# Patient Record
Sex: Female | Born: 1985 | Race: Black or African American | Hispanic: No | Marital: Single | Smoking: Current every day smoker
Health system: Southern US, Community
[De-identification: ages and names within clinical notes are randomized; demographics above are authoritative.]

## PROBLEM LIST (undated history)

## (undated) DIAGNOSIS — D649 Anemia, unspecified: Secondary | ICD-10-CM

---

## 2020-10-21 ENCOUNTER — Other Ambulatory Visit: Payer: Self-pay

## 2020-10-21 ENCOUNTER — Emergency Department (HOSPITAL_COMMUNITY)
Admission: EM | Admit: 2020-10-21 | Discharge: 2020-10-22 | Disposition: A | Discharge status: Home / Self Care | Payer: Self-pay | Attending: Emergency Medicine | Admitting: Emergency Medicine

## 2020-10-21 DIAGNOSIS — B9689 Other specified bacterial agents as the cause of diseases classified elsewhere: Secondary | ICD-10-CM | POA: Insufficient documentation

## 2020-10-21 DIAGNOSIS — N760 Acute vaginitis: Secondary | ICD-10-CM | POA: Insufficient documentation

## 2020-10-21 DIAGNOSIS — K0889 Other specified disorders of teeth and supporting structures: Secondary | ICD-10-CM | POA: Insufficient documentation

## 2020-10-21 LAB — WET PREP, GENITAL
Sperm: NONE SEEN
Trich, Wet Prep: NONE SEEN
WBC, Wet Prep HPF POC: NONE SEEN
Yeast Wet Prep HPF POC: NONE SEEN

## 2020-10-21 LAB — URINALYSIS, ROUTINE W REFLEX MICROSCOPIC
Bilirubin Urine: NEGATIVE
Glucose, UA: NEGATIVE mg/dL
Ketones, ur: NEGATIVE mg/dL
Nitrite: NEGATIVE
Protein, ur: NEGATIVE mg/dL
Specific Gravity, Urine: 1.021 (ref 1.005–1.030)
pH: 5 (ref 5.0–8.0)

## 2020-10-21 MED ORDER — METRONIDAZOLE 500 MG PO TABS: 500.0000 mg | ORAL_TABLET | Freq: Two times a day (BID) | ORAL | 0 refills | Status: AC

## 2020-10-21 MED ORDER — CEFTRIAXONE SODIUM 500 MG IJ SOLR
500.0000 mg | Freq: Once | INTRAMUSCULAR | Status: AC
  Administered 2020-10-21: 500 mg via INTRAMUSCULAR
  Filled 2020-10-21: qty 500

## 2020-10-21 MED ORDER — DOXYCYCLINE HYCLATE 100 MG PO CAPS: 100.0000 mg | ORAL_CAPSULE | Freq: Two times a day (BID) | ORAL | 0 refills | Status: AC

## 2020-10-21 NOTE — Discharge Instructions (Signed)
You were evaluated in the Emergency Department and after careful evaluation, we did not find any emergent condition requiring admission or further testing in the hospital.  Your exam/testing today was overall reassuring.  Your tooth is likely infected and needs to be removed.  Please take the antibiotics and follow up with a dentist.  Please take the antibiotics provided to treat for bacterial vaginosis, tooth infection, and STDs.  Please return to the Emergency Department if you experience any worsening of your condition.  Thank you for allowing [place] to be a part of your care.

## 2020-10-21 NOTE — ED Provider Notes (Signed)
[facility]-EMERGENCY DEPT [facility] Emergency Department Provider Note MRN:  [id number]  Arrival date & time: 10/22/20     Chief Complaint   Dental Pain and Vaginal Discharge   History of Present Illness   Melissa Townsend is a 34 y.o. year-old female with no pertinent PMH presenting to the ED with chief complaint of dental pain.  30 years of dental pain but worse of the past 2 days.  Right lower tooth.  Also with vaginal irritation and discharge since new partner last week.  Discomfort mild, constant.  No fever, no abdominal pain.  Review of Systems  A problem-focused ROS was performed. Positive for tooth pain, vaginal discharge.  Patient denies fever.    Patient's Health History   No past medical history on file.    No family history on file.  Social History   Socioeconomic History  . Marital status: Single    Spouse name: Not on file  . Number of children: Not on file  . Years of education: Not on file  . Highest education level: Not on file  Occupational History  . Not on file  Tobacco Use  . Smoking status: Not on file  Substance and Sexual Activity  . Alcohol use: Not on file  . Drug use: Not on file  . Sexual activity: Not on file  Other Topics Concern  . Not on file  Social History Narrative  . Not on file   Social Determinants of Health   Financial Resource Strain:   . Difficulty of Paying Living Expenses: Not on file  Food Insecurity:   . Worried About Programme researcher, broadcasting/film/video in the Last Year: Not on file  . Ran Out of Food in the Last Year: Not on file  Transportation Needs:   . Lack of Transportation (Medical): Not on file  . Lack of Transportation (Non-Medical): Not on file  Physical Activity:   . Days of Exercise per Week: Not on file  . Minutes of Exercise per Session: Not on file  Stress:   . Feeling of Stress : Not on file  Social Connections:   . Frequency of Communication with Friends and Family: Not on file  . Frequency of Social  Gatherings with Friends and Family: Not on file  . Attends Religious Services: Not on file  . Active Member of Clubs or Organizations: Not on file  . Attends Banker Meetings: Not on file  . Marital Status: Not on file  Intimate Partner Violence:   . Fear of Current or Ex-Partner: Not on file  . Emotionally Abused: Not on file  . Physically Abused: Not on file  . Sexually Abused: Not on file     Physical Exam   Vitals:   10/21/20 2019 10/22/20 0017  BP: 110/68 115/60  Pulse: 79 80  Resp: 16 17  Temp: 98.3 F (36.8 C) 98.7 F (37.1 C)  SpO2: 98% 98%    CONSTITUTIONAL:  well-appearing, NAD NEURO:  Alert and oriented x 3, no focal deficits EYES:  eyes equal and reactive ENT/NECK:  no LAD, no JVD CARDIO:  regular rate, well-perfused, normal S1 and S2 PULM:  CTAB no wheezing or rhonchi GI/GU:  normal bowel sounds, non-distended, non-tender MSK/SPINE:  No gross deformities, no edema SKIN:  no rash, atraumatic PSYCH:  Appropriate speech and behavior  *Additional and/or pertinent findings included in MDM below  Diagnostic and Interventional Summary    EKG Interpretation  Date/Time:    Ventricular Rate:  PR Interval:    QRS Duration:   QT Interval:    QTC Calculation:   R Axis:     Text Interpretation:        Labs Reviewed  WET PREP, GENITAL - Abnormal; Notable for the following components:      Result Value   Clue Cells Wet Prep HPF POC PRESENT (*)    All other components within normal limits  URINALYSIS, ROUTINE W REFLEX MICROSCOPIC - Abnormal; Notable for the following components:   APPearance CLOUDY (*)    Hgb urine dipstick SMALL (*)    Leukocytes,Ua SMALL (*)    Bacteria, UA RARE (*)    All other components within normal limits  POC URINE PREG, ED  GC/CHLAMYDIA PROBE AMP ([place]) NOT AT [facility]    No orders to display    Medications  cefTRIAXone (ROCEPHIN) injection 500 mg (500 mg Intramuscular Given 10/21/20 2336)     Procedures   /  Critical Care Procedures  ED Course and Medical Decision Making  I have reviewed the triage vital signs, the nursing notes, and pertinent available records from the EMR.  Listed above are laboratory and imaging tests that I personally ordered, reviewed, and interpreted and then considered in my medical decision making (see below for details).  No fever, normal vital signs, completely benign abdomen, nothing to suggest PID.  Pelvic exam deferred, patient self swabbed.  Treated empirically for STDs.  Tested positive for BV, will also treat.  These abx should cover tooth infection as well. Appropriate for dc.       Elmer Sow. Pilar Plate, MD [facility] Emergency Medicine [facility] [email or web address]  Final Clinical Impressions(s) / ED Diagnoses     ICD-10-CM   1. Tooth pain  K08.89   2. BV (bacterial vaginosis)  N76.0    B96.89     ED Discharge Orders         Ordered    doxycycline (VIBRAMYCIN) 100 MG capsule  2 times daily        10/21/20 2352    metroNIDAZOLE (FLAGYL) 500 MG tablet  2 times daily        10/21/20 2352           Discharge Instructions Discussed with and Provided to Patient:     Discharge Instructions     You were evaluated in the Emergency Department and after careful evaluation, we did not find any emergent condition requiring admission or further testing in the hospital.  Your exam/testing today was overall reassuring.  Your tooth is likely infected and needs to be removed.  Please take the antibiotics and follow up with a dentist.  Please take the antibiotics provided to treat for bacterial vaginosis, tooth infection, and STDs.  Please return to the Emergency Department if you experience any worsening of your condition.  Thank you for allowing [place] to be a part of your care.        Sabas Sous, MD 10/22/20 [phone]

## 2020-10-21 NOTE — ED Triage Notes (Signed)
Pt with right sided dental pain. Also c/o odorous vaginal discharge for a week.

## 2020-10-23 LAB — GC/CHLAMYDIA PROBE AMP (~~LOC~~) NOT AT ARMC
Chlamydia: NEGATIVE
Comment: NEGATIVE
Comment: NORMAL
Neisseria Gonorrhea: NEGATIVE

## 2021-09-19 ENCOUNTER — Emergency Department (HOSPITAL_COMMUNITY)
Admission: EM | Admit: 2021-09-19 | Discharge: 2021-09-20 | Disposition: A | Discharge status: Home / Self Care | Payer: Medicaid Other | Attending: Emergency Medicine | Admitting: Emergency Medicine

## 2021-09-19 ENCOUNTER — Other Ambulatory Visit: Payer: Self-pay

## 2021-09-19 ENCOUNTER — Encounter (HOSPITAL_COMMUNITY): Payer: Self-pay

## 2021-09-19 DIAGNOSIS — Z20822 Contact with and (suspected) exposure to covid-19: Secondary | ICD-10-CM | POA: Diagnosis not present

## 2021-09-19 DIAGNOSIS — R0781 Pleurodynia: Secondary | ICD-10-CM | POA: Diagnosis not present

## 2021-09-19 DIAGNOSIS — W19XXXA Unspecified fall, initial encounter: Secondary | ICD-10-CM

## 2021-09-19 DIAGNOSIS — O26892 Other specified pregnancy related conditions, second trimester: Secondary | ICD-10-CM | POA: Insufficient documentation

## 2021-09-19 DIAGNOSIS — F17210 Nicotine dependence, cigarettes, uncomplicated: Secondary | ICD-10-CM | POA: Insufficient documentation

## 2021-09-19 DIAGNOSIS — W101XXA Fall (on)(from) sidewalk curb, initial encounter: Secondary | ICD-10-CM | POA: Diagnosis not present

## 2021-09-19 DIAGNOSIS — Z3A25 25 weeks gestation of pregnancy: Secondary | ICD-10-CM | POA: Diagnosis not present

## 2021-09-19 DIAGNOSIS — R1012 Left upper quadrant pain: Secondary | ICD-10-CM | POA: Insufficient documentation

## 2021-09-19 LAB — RESP PANEL BY RT-PCR (FLU A&B, COVID) ARPGX2
Influenza A by PCR: NEGATIVE
Influenza B by PCR: NEGATIVE
SARS Coronavirus 2 by RT PCR: NEGATIVE

## 2021-09-19 NOTE — ED Provider Notes (Signed)
Emergency Medicine Provider Triage Evaluation Note  Melissa Townsend , a 35 y.o. female  was evaluated in triage.  Pt tripped and fell on a curb a few days ago which caused her back pain.  She is 5 months pregnant.  Patient has not felt baby move prior to this fall.  She also is endorsing COVID leg symptoms.  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  Appropriate orders placed.  was informed that the remainder of the evaluation will be completed by another provider, this initial triage assessment does not replace that evaluation, and the importance of remaining in the ED until their evaluation is complete.     Teresa Pelton 09/19/21 2141    2142, MD 09/19/21 2235

## 2021-09-19 NOTE — ED Triage Notes (Signed)
Patient slipped and fell on the curb, has back pain. Patient is [redacted] weeks pregnant. Every time she coughs, yawns, breathes in, her chest and upper left rib hurts even though she fell on her right side. Has a headache and 2 people at her job have Covid.

## 2021-09-20 ENCOUNTER — Encounter (HOSPITAL_COMMUNITY): Payer: Self-pay | Admitting: Radiology

## 2021-09-20 ENCOUNTER — Emergency Department (HOSPITAL_COMMUNITY): Payer: Medicaid Other

## 2021-09-20 NOTE — ED Notes (Signed)
Patient transported to X-ray 

## 2021-09-20 NOTE — Progress Notes (Signed)
OB rapid response nurse called by [facility] at 0205 on 09/20/2021.   Report given that patient Melissa Townsend at approximately 25 weeks reports decreased fetal movement.   OB nurse arrives at bedside at 0230. Monitors applied to assess FHR and CTX. 10 minute FHR tracing obtained showing heart rate baseline of 165 with moderate variability,no decelerations, and no 10x10 or 15x15 accelerations. Maternal vitals signs WNL. Patient reports slipping and falling 2 days ago, having felt only flutters before fall and not feeling flutters since fall. Denies vaginal discharge, denies abdominal contraction pain, denies blurry vision or RUQ pain. Affirms chronic headache prior to pregnancy. Affirms chest pain and pain under left rib with movement.   GA is uncertain as patient has not established prenatal care or had an ultrasound, patient reports 2 previous cesarean sections for breech presentation and will establish care at [facility] Department. No complications with previous pregnancies.   Reports given to Dr. Duane Lope at [phone] of patient and pregnancy status. Provider cleared Patient from the [facility] standpoint at 0311.

## 2021-09-20 NOTE — Discharge Instructions (Addendum)
You can take Tylenol and use ice and heat for pain.  Please follow-up with your OB/GYN.  You should be taking prenatal vitamins.

## 2021-09-20 NOTE — ED Provider Notes (Signed)
[facility]EMERGENCY [facility]PT Provider Note   CSN: [id number] Arrival date & time: 09/19/21  2104     History Chief Complaint  Patient presents with   Chest Pain   Covid Exposure    Melissa Townsend is a 35 y.o. female.  Patient presents to the emergency department with a chief complaint of rib pain.  She states that she is about [redacted] weeks pregnant.  She states that she had a recent mechanical fall in which she slipped and fell to the ground in the rain.  She states that prior to the fall she had been feeling the baby moving regularly, but is not feeling the baby move now.  She denies any vaginal discharge or bleeding.  Denies any fluid gush.  She also reports left sided upper abdominal/rib pain.  Lastly, she also thinks that she was exposed to COVID.  The history is provided by the patient. No language interpreter was used.      History reviewed. No pertinent past medical history.  There are no problems to display for this patient.   Past Surgical History:  Procedure Laterality Date   CESAREAN SECTION       OB History   No obstetric history on file.     History reviewed. No pertinent family history.  Social History   Tobacco Use   Smoking status: Every Day    Packs/day: 0.50    Years: 15.00    Pack years: 7.50    Types: Cigarettes   Smokeless tobacco: Never  Substance Use Topics   Alcohol use: Not Currently   Drug use: Never    Home Medications Prior to Admission medications   Not on File    Allergies    Patient has no known allergies.  Review of Systems   Review of Systems  All other systems reviewed and are negative.  Physical Exam Updated Vital Signs BP 113/62 (BP Location: Right Arm)   Pulse 92   Temp 98.5 F (36.9 C) (Oral)   Resp 16   Ht 5\' 5"  (1.651 m)   Wt 47.6 kg   SpO2 100%   BMI 17.47 kg/m   Physical Exam Vitals and nursing note reviewed.  Constitutional:      General: She is not in acute distress.     Appearance: She is well-developed.  HENT:     Head: Normocephalic and atraumatic.  Eyes:     Conjunctiva/sclera: Conjunctivae normal.  Cardiovascular:     Rate and Rhythm: Normal rate and regular rhythm.     Heart sounds: No murmur heard. Pulmonary:     Effort: Pulmonary effort is normal. No respiratory distress.     Breath sounds: Normal breath sounds.     Comments: Left-sided chest wall tenderness Abdominal:     Palpations: Abdomen is soft.     Tenderness: There is no abdominal tenderness.     Comments: Gravid  Musculoskeletal:     Cervical back: Neck supple.  Skin:    General: Skin is warm and dry.  Neurological:     Mental Status: She is alert and oriented to person, place, and time.  Psychiatric:        Mood and Affect: Mood normal.        Behavior: Behavior normal.    ED Results / Procedures / Treatments   Labs (all labs ordered are listed, but only abnormal results are displayed) Labs Reviewed  RESP PANEL BY RT-PCR (FLU A&B, COVID) [id number]    EKG  None  Radiology No results found.  Procedures Procedures   Medications Ordered in ED Medications - No data to display  ED Course  I have reviewed the triage vital signs and the nursing notes.  Pertinent labs & imaging results that were available during my care of the patient were reviewed by me and considered in my medical decision making (see chart for details).    MDM Rules/Calculators/A&P                           Patient is approximately 25 weeks.  States that she is not feeling the baby move as much after a fall.  Will have rapid response OB RN, to evaluate the patient.  Will check plain films of left ribs with a shield.  Plain films of the chest and ribs are negative.  Patient seen by and cleared by OB rapid response.  See OB RN note for details.  Will discharge patient home with PCP and OB/GYN follow-up. Final Clinical Impression(s) / ED Diagnoses Final diagnoses:  Fall, initial encounter  Rib pain     Rx / DC Orders ED Discharge Orders     None        Roxy Horseman, PA-C 09/20/21 7322    Mesner, Barbara Cower, MD 09/20/21 [phone]

## 2021-09-28 ENCOUNTER — Other Ambulatory Visit: Payer: Self-pay

## 2021-09-28 ENCOUNTER — Encounter: Payer: Self-pay | Admitting: Obstetrics and Gynecology

## 2021-09-28 ENCOUNTER — Other Ambulatory Visit: Payer: Self-pay | Admitting: Obstetrics and Gynecology

## 2021-09-28 ENCOUNTER — Observation Stay
Admission: EM | Admit: 2021-09-28 | Discharge: 2021-09-28 | Disposition: A | Discharge status: Home / Self Care | Payer: Medicaid Other | Attending: Obstetrics and Gynecology | Admitting: Obstetrics and Gynecology

## 2021-09-28 DIAGNOSIS — F17210 Nicotine dependence, cigarettes, uncomplicated: Secondary | ICD-10-CM | POA: Diagnosis not present

## 2021-09-28 DIAGNOSIS — Z3A17 17 weeks gestation of pregnancy: Secondary | ICD-10-CM | POA: Diagnosis not present

## 2021-09-28 DIAGNOSIS — O26892 Other specified pregnancy related conditions, second trimester: Secondary | ICD-10-CM | POA: Diagnosis not present

## 2021-09-28 DIAGNOSIS — O99332 Smoking (tobacco) complicating pregnancy, second trimester: Secondary | ICD-10-CM | POA: Insufficient documentation

## 2021-09-28 DIAGNOSIS — R1032 Left lower quadrant pain: Secondary | ICD-10-CM | POA: Insufficient documentation

## 2021-09-28 DIAGNOSIS — R109 Unspecified abdominal pain: Secondary | ICD-10-CM

## 2021-09-28 HISTORY — DX: Anemia, unspecified: D64.9

## 2021-09-28 LAB — URINALYSIS, COMPLETE (UACMP) WITH MICROSCOPIC
Bacteria, UA: NONE SEEN
Bilirubin Urine: NEGATIVE
Glucose, UA: NEGATIVE mg/dL
Hgb urine dipstick: NEGATIVE
Ketones, ur: 5 mg/dL — AB
Leukocytes,Ua: NEGATIVE
Nitrite: NEGATIVE
Protein, ur: 100 mg/dL — AB
Specific Gravity, Urine: 1.026 (ref 1.005–1.030)
pH: 5 (ref 5.0–8.0)

## 2021-09-28 LAB — WET PREP, GENITAL
Sperm: NONE SEEN
Trich, Wet Prep: NONE SEEN
WBC, Wet Prep HPF POC: NONE SEEN
Yeast Wet Prep HPF POC: NONE SEEN

## 2021-09-28 LAB — CHLAMYDIA/NGC RT PCR (ARMC ONLY)
Chlamydia Tr: NOT DETECTED
N gonorrhoeae: NOT DETECTED

## 2021-09-28 MED ORDER — METRONIDAZOLE 500 MG PO TABS: 500.0000 mg | ORAL_TABLET | Freq: Two times a day (BID) | ORAL | 0 refills | Status: AC

## 2021-09-28 NOTE — Progress Notes (Signed)
Wet prep resulted with Clue cells, Rx Flagyl to pharmacy on file.

## 2021-09-28 NOTE — Progress Notes (Signed)
Phone number confirmed for [facility] to contact if any medications needed

## 2021-09-28 NOTE — Discharge Summary (Signed)
Melissa Townsend is a 35 y.o. female. She is at [redacted]w[redacted]d gestation. Patient's last menstrual period was 05/19/2021. Estimated Date of Delivery: 03/02/22  Prenatal care site: unassigned pt- no PNC  Current pregnancy complicated by:  No PNC 2 prior CS Hx STI- trich and chlamydia treated 07/2021  Chief complaint: abdominal cramping in lower left quadrant, rates pain at 8/10. Started feeling pain around 4pm. Takes percocet regularly for tooth pain. States that she has a malordorous white vaginal discharge.   S: Resting comfortably. no VB.no LOF,  reports feeling fetal movement flutters. Denies: HA, visual changes, SOB, or RUQ/epigastric pain  Maternal Medical History:   Past Medical History:  Diagnosis Date   Anemia     Past Surgical History:  Procedure Laterality Date   CESAREAN SECTION      No Known Allergies  Prior to Admission medications   Medication Sig Start Date End Date Taking? Authorizing Provider  oxyCODONE-acetaminophen (PERCOCET) 10-325 MG tablet Take 1 tablet by mouth every 4 (four) hours as needed for pain.   Yes [provider]      Social History: She  reports that she has been smoking cigarettes. She has a 7.50 pack-year smoking history. She has never used smokeless tobacco. She reports that she does not currently use alcohol. She reports current drug use. Drug: Marijuana.  Family History:  no history of gyn cancers  Review of Systems: A full review of systems was performed and negative except as noted in the HPI.     O:  BP 117/63 (BP Location: Left Arm)   Pulse 84   Temp 98.1 F (36.7 C) (Oral)   Resp 16   Ht 5\' 5"  (1.651 m)   Wt 47.6 kg   LMP 05/19/2021 Comment: [redacted] wks pregnant. pt will be double shielded.  BMI 17.47 kg/m  No results found for this or any previous visit (from the past 48 hour(s)).   Constitutional: NAD, AAOx3  HE/ENT: extraocular movements grossly intact, moist mucous membranes CV: RRR PULM: nl respiratory effort,  CTABL     Abd: gravid, non-tender, non-distended, soft      Ext: Non-tender, Nonedematous   Psych: mood appropriate, speech normal Pelvic: deferred  Fetal  monitoring: Pos FHR noted on bedside 05/21/2021.    A/P: 35 y.o. [redacted]w[redacted]d here for antenatal surveillance for abdominal pain, round ligament pain.   Principle Diagnosis:  round ligament pain, [redacted]wks gestation, no PNC  Sent UA, wet prep and GC/CT; pending results.  Will call with results/Rx if needed.  FHR present DC home prior to results due to pts ride available.  D/c home stable, precautions reviewed, follow-up as scheduled.    [redacted]w[redacted]d, CNM 09/28/2021  5:32 AM

## 2021-09-28 NOTE — Progress Notes (Signed)

## 2021-09-28 NOTE — OB Triage Note (Signed)
Pt Melissa Townsend 35 y.o. presents to labor and delivery triage reporting abdominal cramping in lower left quadrant, rates pain at 8/10. Started feeling pain around 4pm. Takes percocet regularly for tooth pain. States that she has a malordorous white vaginal discharge.Pt is a [id number] at Unknown. Pt denies signs and symptons consistent with rupture of membranes or active vaginal bleeding. Pt denies contractions and states flutters. External FM and TOCO applied to non-tender abdomen and assessing. Initial FHR not heard. Vital signs obtained and within normal limits. Provider notified of pt.

## 2022-02-27 ENCOUNTER — Emergency Department (HOSPITAL_COMMUNITY): Payer: Medicaid Other

## 2022-02-27 ENCOUNTER — Encounter (HOSPITAL_COMMUNITY): Payer: Self-pay | Admitting: Emergency Medicine

## 2022-02-27 ENCOUNTER — Emergency Department (HOSPITAL_COMMUNITY)
Admission: EM | Admit: 2022-02-27 | Discharge: 2022-02-27 | Disposition: A | Discharge status: Home / Self Care | Payer: Medicaid Other | Attending: Emergency Medicine | Admitting: Emergency Medicine

## 2022-02-27 DIAGNOSIS — R519 Headache, unspecified: Secondary | ICD-10-CM | POA: Diagnosis present

## 2022-02-27 DIAGNOSIS — Z79899 Other long term (current) drug therapy: Secondary | ICD-10-CM | POA: Diagnosis not present

## 2022-02-27 LAB — CBC WITH DIFFERENTIAL/PLATELET
Abs Immature Granulocytes: 0.03 10*3/uL (ref 0.00–0.07)
Basophils Absolute: 0 10*3/uL (ref 0.0–0.1)
Basophils Relative: 0 %
Eosinophils Absolute: 0.1 10*3/uL (ref 0.0–0.5)
Eosinophils Relative: 1 %
HCT: 31 % — ABNORMAL LOW (ref 36.0–46.0)
Hemoglobin: 10.5 g/dL — ABNORMAL LOW (ref 12.0–15.0)
Immature Granulocytes: 0 %
Lymphocytes Relative: 13 %
Lymphs Abs: 1 10*3/uL (ref 0.7–4.0)
MCH: 30.7 pg (ref 26.0–34.0)
MCHC: 33.9 g/dL (ref 30.0–36.0)
MCV: 90.6 fL (ref 80.0–100.0)
Monocytes Absolute: 0.3 10*3/uL (ref 0.1–1.0)
Monocytes Relative: 4 %
Neutro Abs: 6.1 10*3/uL (ref 1.7–7.7)
Neutrophils Relative %: 82 %
Platelets: 218 10*3/uL (ref 150–400)
RBC: 3.42 MIL/uL — ABNORMAL LOW (ref 3.87–5.11)
RDW: 16.3 % — ABNORMAL HIGH (ref 11.5–15.5)
WBC: 7.5 10*3/uL (ref 4.0–10.5)
nRBC: 0 % (ref 0.0–0.2)

## 2022-02-27 LAB — COMPREHENSIVE METABOLIC PANEL
ALT: 21 U/L (ref 0–44)
AST: 22 U/L (ref 15–41)
Albumin: 3.2 g/dL — ABNORMAL LOW (ref 3.5–5.0)
Alkaline Phosphatase: 110 U/L (ref 38–126)
Anion gap: 8 (ref 5–15)
BUN: 8 mg/dL (ref 6–20)
CO2: 25 mmol/L (ref 22–32)
Calcium: 8.5 mg/dL — ABNORMAL LOW (ref 8.9–10.3)
Chloride: 104 mmol/L (ref 98–111)
Creatinine, Ser: 0.75 mg/dL (ref 0.44–1.00)
GFR, Estimated: >60 mL/min (ref >60)
Glucose, Bld: 107 mg/dL — ABNORMAL HIGH (ref 70–99)
Potassium: 3.3 mmol/L — ABNORMAL LOW (ref 3.5–5.1)
Sodium: 137 mmol/L (ref 135–145)
Total Bilirubin: 0.4 mg/dL (ref 0.3–1.2)
Total Protein: 6.9 g/dL (ref 6.5–8.1)

## 2022-02-27 LAB — URINALYSIS, ROUTINE W REFLEX MICROSCOPIC
Bilirubin Urine: NEGATIVE
Glucose, UA: NEGATIVE mg/dL
Ketones, ur: 15 mg/dL — AB
Leukocytes,Ua: NEGATIVE
Nitrite: NEGATIVE
Protein, ur: 100 mg/dL — AB
Specific Gravity, Urine: 1.02 (ref 1.005–1.030)
pH: 7 (ref 5.0–8.0)

## 2022-02-27 LAB — URINALYSIS, MICROSCOPIC (REFLEX): Bacteria, UA: NONE SEEN

## 2022-02-27 LAB — I-STAT BETA HCG BLOOD, ED (MC, WL, AP ONLY): I-stat hCG, quantitative: 174.2 m[IU]/mL — ABNORMAL HIGH (ref <5)

## 2022-02-27 MED ORDER — BUTALBITAL-APAP-CAFFEINE 50-325-40 MG PO TABS
1.0000 | ORAL_TABLET | Freq: Once | ORAL | Status: AC
  Administered 2022-02-27: 1 via ORAL
  Filled 2022-02-27: qty 1

## 2022-02-27 MED ORDER — OXYCODONE HCL 5 MG PO TABS: 5.0000 mg | ORAL_TABLET | ORAL | 0 refills | Status: DC | PRN

## 2022-02-27 MED ORDER — BUTALBITAL-APAP-CAFFEINE 50-325-40 MG PO TABS: 1.0000 | ORAL_TABLET | Freq: Four times a day (QID) | ORAL | 0 refills | Status: AC | PRN

## 2022-02-27 MED ORDER — PROCHLORPERAZINE EDISYLATE 10 MG/2ML IJ SOLN
10.0000 mg | Freq: Once | INTRAMUSCULAR | Status: DC
  Filled 2022-02-27: qty 2

## 2022-02-27 MED ORDER — LABETALOL HCL 100 MG PO TABS: 200.0000 mg | ORAL_TABLET | Freq: Two times a day (BID) | ORAL | 0 refills | Status: DC

## 2022-02-27 MED ORDER — DIPHENHYDRAMINE HCL 50 MG/ML IJ SOLN
25.0000 mg | Freq: Once | INTRAMUSCULAR | Status: AC
  Administered 2022-02-27: 25 mg via INTRAVENOUS
  Filled 2022-02-27: qty 1

## 2022-02-27 MED ORDER — SODIUM CHLORIDE 0.9 % IV BOLUS
1000.0000 mL | Freq: Once | INTRAVENOUS | Status: AC
  Administered 2022-02-27: 1000 mL via INTRAVENOUS

## 2022-02-27 MED ORDER — LABETALOL HCL 200 MG PO TABS
200.0000 mg | ORAL_TABLET | Freq: Once | ORAL | Status: AC
  Administered 2022-02-27: 200 mg via ORAL
  Filled 2022-02-27: qty 1

## 2022-02-27 NOTE — ED Provider Notes (Signed)
?[facility]-EMERGENCY DEPT ?Provider Note ? ? ?CSN: [id number] ?Arrival date & time: 02/27/22  1422 ? ?  ? ?History ? ?Chief Complaint  ?Patient presents with  ? Postpartum Complications  ? ? ?Melissa Townsend is a 36 y.o. female. ? ?Headache on and off for 4 days after epidural for C-section. Patient states lying flat has helped but not much. Narcotics and OTC meds not helping. Denies any visual changes, leg weakness, loss of bowl or bladder. Patient states some pain in the low abdomen at time but states feels no different than prior c section pain. Some low back pain at times. No weakness or numbness otherwise.   Patient need blood transfusion x 3 after csection and noticed to have high blood pressure after getting blood products and now on procardia. ? ?The history is provided by the patient.  ?Headache ?Associated symptoms: no abdominal pain, no back pain, no blurred vision, no congestion, no cough, no diarrhea, no dizziness, no drainage, no ear pain, no eye pain, no facial pain, no fatigue, no fever, no focal weakness, no hearing loss, no loss of balance, no myalgias, no nausea, no near-syncope, no neck pain, no neck stiffness, no numbness, no paresthesias, no photophobia, no seizures, no sinus pressure, no sore throat, no swollen glands, no syncope, no tingling, no URI, no visual change, no vomiting and no weakness   ? ?  ? ?Home Medications ?Prior to Admission medications   ?Medication Sig Start Date End Date Taking? Authorizing Provider  ?butalbital-acetaminophen-caffeine (FIORICET) 50-325-40 MG tablet Take 1-2 tablets by mouth every 6 (six) hours as needed for headache. 02/27/22 02/27/23 Yes Zoi Devine, DO  ?labetalol (NORMODYNE) 100 MG tablet Take 2 tablets (200 mg total) by mouth 2 (two) times daily for 14 days. 02/27/22 03/13/22 Yes Senai Kingsley, DO  ?oxyCODONE (ROXICODONE) 5 MG immediate release tablet Take 1 tablet (5 mg total) by mouth every 4 (four) hours as needed for up to  10 doses for breakthrough pain. 02/27/22  Yes Corah Willeford, DO  ?oxyCODONE-acetaminophen (PERCOCET) 10-325 MG tablet Take 1 tablet by mouth every 4 (four) hours as needed for pain.    [provider]  ?   ? ?Allergies    ?Patient has no known allergies.   ? ?Review of Systems   ?Review of Systems  ?Constitutional:  Negative for fatigue and fever.  ?HENT:  Negative for congestion, ear pain, hearing loss, postnasal drip, sinus pressure and sore throat.   ?Eyes:  Negative for blurred vision, photophobia and pain.  ?Respiratory:  Negative for cough.   ?Cardiovascular:  Negative for syncope and near-syncope.  ?Gastrointestinal:  Negative for abdominal pain, diarrhea, nausea and vomiting.  ?Musculoskeletal:  Negative for back pain, myalgias, neck pain and neck stiffness.  ?Neurological:  Positive for headaches. Negative for dizziness, focal weakness, seizures, weakness, numbness, paresthesias and loss of balance.  ? ?Physical Exam ?Updated Vital Signs ? ?ED Triage Vitals [02/27/22 1431]  ?Enc Vitals Group  ?   BP (!) 138/92  ?   Pulse Rate 69  ?   Resp 17  ?   Temp 98.6 ?F (37 ?C)  ?   Temp Source Oral  ?   SpO2 99 %  ?   Weight   ?   Height   ?   Head Circumference   ?   Peak Flow   ?   Pain Score   ?   Pain Loc   ?   Pain Edu?   ?  Excl. in GC?   ? ? ?Physical Exam ?Vitals and nursing note reviewed.  ?Constitutional:   ?   General: She is not in acute distress. ?   Appearance: She is well-developed. She is not ill-appearing.  ?HENT:  ?   Head: Normocephalic and atraumatic.  ?   Nose: Nose normal.  ?   Mouth/Throat:  ?   Mouth: Mucous membranes are moist.  ?Eyes:  ?   Extraocular Movements: Extraocular movements intact.  ?   Conjunctiva/sclera: Conjunctivae normal.  ?   Pupils: Pupils are equal, round, and reactive to light.  ?Cardiovascular:  ?   Rate and Rhythm: Normal rate and regular rhythm.  ?   Pulses: Normal pulses.  ?   Heart sounds: Normal heart sounds. No murmur heard. ?Pulmonary:  ?   Effort:  Pulmonary effort is normal. No respiratory distress.  ?   Breath sounds: Normal breath sounds.  ?Abdominal:  ?   Palpations: Abdomen is soft.  ?   Tenderness: There is no abdominal tenderness. There is no guarding.  ?   Comments: C section scar well appearing.  ?Musculoskeletal:  ?   Cervical back: Neck supple.  ?Skin: ?   General: Skin is warm and dry.  ?   Capillary Refill: Capillary refill takes less than 2 seconds.  ?Neurological:  ?   General: No focal deficit present.  ?   Mental Status: She is alert and oriented to person, place, and time.  ?   Cranial Nerves: No cranial nerve deficit.  ?   Sensory: No sensory deficit.  ?   Motor: No weakness.  ?   Coordination: Coordination normal.  ? ? ?ED Results / Procedures / Treatments   ?Labs ?(all labs ordered are listed, but only abnormal results are displayed) ?Labs Reviewed  ?CBC WITH DIFFERENTIAL/PLATELET - Abnormal; Notable for the following components:  ?    Result Value  ? RBC 3.42 (*)   ? Hemoglobin 10.5 (*)   ? HCT 31.0 (*)   ? RDW 16.3 (*)   ? All other components within normal limits  ?COMPREHENSIVE METABOLIC PANEL - Abnormal; Notable for the following components:  ? Potassium 3.3 (*)   ? Glucose, Bld 107 (*)   ? Calcium 8.5 (*)   ? Albumin 3.2 (*)   ? All other components within normal limits  ?URINALYSIS, ROUTINE W REFLEX MICROSCOPIC - Abnormal; Notable for the following components:  ? Hgb urine dipstick SMALL (*)   ? Ketones, ur 15 (*)   ? Protein, ur 100 (*)   ? All other components within normal limits  ?I-STAT BETA HCG BLOOD, ED ([facility], [facility], [facility] ONLY) - Abnormal; Notable for the following components:  ? I-stat hCG, quantitative 174.2 (*)   ? All other components within normal limits  ?URINALYSIS, MICROSCOPIC (REFLEX)  ? ? ?EKG ?EKG Interpretation ? ?Date/Time:  Sunday February 27 2022 14:54:20 EDT ?Ventricular Rate:  73 ?PR Interval:  165 ?QRS Duration: 78 ?QT Interval:  404 ?QTC Calculation: 446 ?R Axis:   87 ?Text Interpretation: Sinus rhythm Consider left  ventricular hypertrophy Confirmed by Virgina Norfolk [id number]) on 02/27/2022 3:19:10 PM ? ?Radiology ?CT Head Wo Contrast ? ?Result Date: 02/27/2022 ?CLINICAL DATA:  Headache.  Three days post partum.  Hypertension. EXAM: CT HEAD WITHOUT CONTRAST TECHNIQUE: Contiguous axial images were obtained from the base of the skull through the vertex without intravenous contrast. RADIATION DOSE REDUCTION: This exam was performed according to the departmental dose-optimization program which includes automated exposure  control, adjustment of the mA and/or kV according to patient size and/or use of iterative reconstruction technique. COMPARISON:  None. FINDINGS: Brain: No evidence of acute infarction, hemorrhage, hydrocephalus, extra-axial collection or mass lesion/mass effect. Vascular: No hyperdense vessel or unexpected calcification. Skull: Normal. Negative for fracture or focal lesion. Sinuses/Orbits: Visualized globes and orbits are unremarkable. Visualized sinuses are clear. Other: None. IMPRESSION: Normal unenhanced CT scan of the brain. Electronically Signed   By: Amie Portlandavid  Ormond M.D.   On: 02/27/2022 15:42  ? ?DG Chest Portable 1 View ? ?Result Date: 02/27/2022 ?CLINICAL DATA:  Weakness EXAM: PORTABLE CHEST 1 VIEW COMPARISON:  09/20/2021 FINDINGS: The heart size and mediastinal contours are within normal limits. Both lungs are clear. The visualized skeletal structures are unremarkable. IMPRESSION: No active disease. Electronically Signed   By: Duanne GuessNicholas  Plundo D.O.   On: 02/27/2022 16:02   ? ?Procedures ?Procedures  ? ? ?Medications Ordered in ED ?Medications  ?prochlorperazine (COMPAZINE) injection 10 mg (10 mg Intravenous Not Given 02/27/22 1630)  ?labetalol (NORMODYNE) tablet 200 mg (has no administration in time range)  ?sodium chloride 0.9 % bolus 1,000 mL (1,000 mLs Intravenous New Bag/Given 02/27/22 1622)  ?diphenhydrAMINE (BENADRYL) injection 25 mg (25 mg Intravenous Given 02/27/22 1623)  ?butalbital-acetaminophen-caffeine  (FIORICET) 50-325-40 MG per tablet 1 tablet (1 tablet Oral Given 02/27/22 1628)  ? ? ?ED Course/ Medical Decision Making/ A&P ?  ?                        ?Medical Decision Making ?Amount and/or Complexity of

## 2022-02-27 NOTE — ED Provider Triage Note (Signed)
Emergency Medicine Provider Triage Evaluation Note ? ?Melissa Townsend , a 36 y.o. female  was evaluated in triage.  Pt complains of headache, neck pain, sob. She had a csection 3 days ago. She has been treated for HTN throughout pregnancy. Reportedly refused to go to MAU PTA. . ? ?Review of Systems  ?Positive: Headache, neck pain, sob ?Negative: fever ? ?Physical Exam  ?BP (!) 138/92 (BP Location: Left Arm)   Pulse 69   Temp 98.6 ?F (37 ?C) (Oral)   Resp 17   LMP 05/19/2021 Comment: [redacted] wks pregnant. pt will be double shielded.  SpO2 99%  ?Gen:   Awake, no distress   ?Resp:  Normal effort  ?MSK:   Moves extremities without difficulty  ?Other:  Perrl, eoms intact, clear speech, no facial droop, moving all extremities ? ?Medical Decision Making  ?Medically screening exam initiated at 2:39 PM.  Appropriate orders placed.  Melissa Townsend was informed that the remainder of the evaluation will be completed by another provider, this initial triage assessment does not replace that evaluation, and the importance of remaining in the ED until their evaluation is complete. ? ? ?  ?Rodney Booze, PA-C ?02/27/22 1445 ? ?

## 2022-02-27 NOTE — Discharge Instructions (Addendum)
Recommend taking Fioricet before taking any Roxicodone.  Both of these medications are mildly sedating.  I would make sure that you take these medications at least 4 to 6 hours apart from each other.  Discontinue your nifedipine and start labetalol.  You have been given the first dose tonight.  Take your next dose in the morning and follow-up with your OB/GYN tomorrow to further discuss your headache.  At this time do not believe that it is from preeclampsia and likely from epidural headache.  However they need to continue to monitor you for preeclampsia.  Please return if symptoms worsen.  Recommend calling your OB as well. ?

## 2022-02-27 NOTE — ED Triage Notes (Signed)
Per EMS, patient from home, three days postpartum c-section, c/o back pain at site of epidural radiating to neck and head. Reports hypertension and anemia throughout pregnancy. ? ?BP 122/70 ?HR 88 ?98% RA ?

## 2022-03-08 ENCOUNTER — Encounter (HOSPITAL_COMMUNITY): Payer: Self-pay

## 2022-03-08 ENCOUNTER — Inpatient Hospital Stay (HOSPITAL_COMMUNITY)
Admission: AD | Admit: 2022-03-08 | Discharge: 2022-03-08 | Disposition: A | Discharge status: Home / Self Care | Payer: Medicaid Other | Attending: Family Medicine | Admitting: Family Medicine

## 2022-03-08 ENCOUNTER — Inpatient Hospital Stay (HOSPITAL_COMMUNITY): Payer: Medicaid Other

## 2022-03-08 DIAGNOSIS — O9089 Other complications of the puerperium, not elsewhere classified: Secondary | ICD-10-CM

## 2022-03-08 DIAGNOSIS — O902 Hematoma of obstetric wound: Secondary | ICD-10-CM | POA: Diagnosis not present

## 2022-03-08 DIAGNOSIS — O99335 Smoking (tobacco) complicating the puerperium: Secondary | ICD-10-CM | POA: Insufficient documentation

## 2022-03-08 DIAGNOSIS — F17210 Nicotine dependence, cigarettes, uncomplicated: Secondary | ICD-10-CM | POA: Insufficient documentation

## 2022-03-08 LAB — CBC
HCT: 33.2 % — ABNORMAL LOW (ref 36.0–46.0)
Hemoglobin: 11 g/dL — ABNORMAL LOW (ref 12.0–15.0)
MCH: 30.4 pg (ref 26.0–34.0)
MCHC: 33.1 g/dL (ref 30.0–36.0)
MCV: 91.7 fL (ref 80.0–100.0)
Platelets: 307 10*3/uL (ref 150–400)
RBC: 3.62 MIL/uL — ABNORMAL LOW (ref 3.87–5.11)
RDW: 15.1 % (ref 11.5–15.5)
WBC: 8.4 10*3/uL (ref 4.0–10.5)
nRBC: 0 % (ref 0.0–0.2)

## 2022-03-08 LAB — TYPE AND SCREEN
ABO/RH(D): A POS
Antibody Screen: NEGATIVE

## 2022-03-08 MED ORDER — LACTATED RINGERS IV SOLN: INTRAVENOUS | Status: DC

## 2022-03-08 MED ORDER — IOHEXOL 350 MG/ML SOLN
100.0000 mL | Freq: Once | INTRAVENOUS | Status: AC | PRN
  Administered 2022-03-08: 100 mL via INTRAVENOUS

## 2022-03-08 MED ORDER — MISOPROSTOL 200 MCG PO TABS: 800.0000 ug | ORAL_TABLET | Freq: Once | ORAL | 1 refills | Status: DC

## 2022-03-08 MED ORDER — OXYCODONE-ACETAMINOPHEN 10-325 MG PO TABS: 1.0000 | ORAL_TABLET | ORAL | 0 refills | Status: DC | PRN

## 2022-03-08 MED ORDER — HYDROMORPHONE HCL 1 MG/ML IJ SOLN
1.0000 mg | INTRAMUSCULAR | Status: DC | PRN
  Administered 2022-03-08: 1 mg via INTRAVENOUS
  Filled 2022-03-08: qty 1

## 2022-03-08 NOTE — Discharge Instructions (Signed)
-  take misoprostal pills tonight and then repeat in 3 days if still continuing to bleed ?-return to MAU if feeling lightheaded, dizzy, passing large clots.  ?- ?

## 2022-03-08 NOTE — MAU Note (Signed)
Transport at bedside to take patient to CT.

## 2022-03-08 NOTE — MAU Note (Signed)
.  Melissa Townsend is a 36 y.o. at [redacted]w[redacted]d here in MAU reporting: heavy vaginal bleeding and abdominal pain that began around 1900 on 03/07/22. Abdominal pain 10/10, HA 7/10. Patient had a C/S on 02/23/22 due to HBP. Reports using 7 regular pads within the last hour with some large clots.  ? ?Pain score: 10/10, 7/10 ?Vitals:  ? 03/08/22 1240  ?BP: 115/71  ?Pulse: 81  ?Resp: 16  ?Temp: 98.4 ?F (36.9 ?C)  ?SpO2: 99%  ?   ? ? ?

## 2022-03-08 NOTE — MAU Provider Note (Signed)
Patient Melissa Townsend is a 36 y.o. G3P2003 ? At 2 weeks post repeat C/section (now s/p 3 c/sections). She reports that she was sent home from the hospital with a hematoma; she reports it has been hurting since she left the hospital.  ? ? ?She reports that she started back working at [employer] last week. She has been very active at work.  ?Reports heavy vaginal bleeding that started yesterday; last night she said she went through multiple pads. She has not had a BM in three days. Patient denies nausea, vomiting, fever, chest pain, SOB, dizziness.  ?History  ?  ? ?CSN: [id number] ? ?Arrival date and time: 03/08/22 1202 ? ? Event Date/Time  ? First Provider Initiated Contact with Patient 03/08/22 1254   ?  ? ?Chief Complaint  ?Patient presents with  ? Vaginal Bleeding  ? ?Vaginal Bleeding ?The patient's primary symptoms include vaginal bleeding. The current episode started yesterday. The problem occurs constantly. Associated symptoms include constipation. Pertinent negatives include no diarrhea, dysuria, fever, urgency or vomiting. The vaginal discharge was bloody. The vaginal bleeding is typical of menses. She has not been passing clots. She has not been passing tissue.  ? ?OB History   ? ? Gravida  ?3  ? Para  ?2  ? Term  ?2  ? Preterm  ?   ? AB  ?   ? Living  ?3  ?  ? ? SAB  ?   ? IAB  ?   ? Ectopic  ?   ? Multiple  ?   ? Live Births  ?3  ?   ?  ?  ? ? ?Past Medical History:  ?Diagnosis Date  ? Anemia   ? ? ?Past Surgical History:  ?Procedure Laterality Date  ? CESAREAN SECTION    ? ? ?History reviewed. No pertinent family history. ? ?Social History  ? ?Tobacco Use  ? Smoking status: Every Day  ?  Packs/day: 0.50  ?  Years: 15.00  ?  Pack years: 7.50  ?  Types: Cigarettes  ? Smokeless tobacco: Never  ?Vaping Use  ? Vaping Use: Former  ?Substance Use Topics  ? Alcohol use: Not Currently  ? Drug use: Not Currently  ?  Types: Marijuana  ? ? ?Allergies: No Known Allergies ? ?No medications prior to admission.   ? ? ?Review of Systems  ?Constitutional:  Negative for fever.  ?Respiratory: Negative.    ?Cardiovascular: Negative.   ?Gastrointestinal:  Positive for constipation. Negative for diarrhea and vomiting.  ?Genitourinary:  Positive for vaginal bleeding. Negative for dysuria and urgency.  ?Neurological: Negative.   ?Psychiatric/Behavioral: Negative.    ?Physical Exam  ? ?Blood pressure 117/78, pulse 74, temperature 98.4 ?F (36.9 ?C), temperature source Oral, resp. rate 16, height 5\' 4"  (1.626 m), weight 50.3 kg, last menstrual period 05/19/2021, SpO2 99 %, unknown if currently breastfeeding. ? ?Physical Exam ?Constitutional:   ?   Appearance: Normal appearance.  ?Cardiovascular:  ?   Rate and Rhythm: Normal rate.  ?Pulmonary:  ?   Effort: Pulmonary effort is normal.  ?Abdominal:  ?   General: Abdomen is flat. Bowel sounds are normal. There is no distension.  ?   Palpations: Abdomen is soft.  ?   Tenderness: There is abdominal tenderness. There is guarding.  ?Genitourinary: ?   General: Normal vulva.  ?   Comments: NEFG; dark red blood in the vagina; no clots; no odor, patient is extremely uncomfortable with speculum exam ?Musculoskeletal:     ?  General: Normal range of motion.  ?Skin: ?   General: Skin is warm.  ?Neurological:  ?   General: No focal deficit present.  ?   Mental Status: She is alert.  ?Psychiatric:     ?   Mood and Affect: Mood normal.  ? ? ?MAU Course  ?Procedures ? ?MDM ?-Hgb is 11; platelets are 307 ?Reassessment (6:49 AM) ?-patient reports bleeding has tapering off; she has had 1 mg of dilaudid and feels that it is helping ?-patient sent to CT due to abdominal pain and concern for possible post-op complications; CT scan shows seroma measuring 2.4x 1.3 on LLQ but otherwise unremarkable; images were reviewed with Dr. Kennon Rounds.  ?-patient offered cytotec for bleeding but she elected to leave and take outpatient ?-records reviewed from c/section in [place]; no remarkable events during surgery and she  was discharged home with small hematoma at incision site on Left lower quadrant ?Assessment and Plan  ? ?1. Postpartum hemorrhage, unspecified type   ?2. Cesarean section wound seroma, postpartum   ? ?-patient stable for discharge; strict bleeding precautions reviewed as well as importance of rest breaks as her activity level after the c/section could be contributing to her ongoing pain and bleeding ?-discussed how to take cytotec, side effects; limited supply of percocet RX written as well ?-patient to keep follow up appt next week with her OB in [place] ?-all questions answered  ? ?Mervyn Skeeters Alexandra Lipps ?03/09/2022, 6:49 AM  ?

## 2022-03-08 NOTE — MAU Note (Signed)
Patient signed physical copy of AVS and left without BP being taken. ?

## 2022-09-19 DIAGNOSIS — K0889 Other specified disorders of teeth and supporting structures: Secondary | ICD-10-CM | POA: Diagnosis not present

## 2022-09-19 DIAGNOSIS — L0201 Cutaneous abscess of face: Secondary | ICD-10-CM | POA: Diagnosis not present

## 2022-11-25 IMAGING — CT CT CTA ABD/PEL W/CM AND/OR W/O CM
2 of 11 series · 13 of 46 positions shown, 17 images · IV contrast (APPLIED)
Comparison: None.

CLINICAL DATA: Left upper quadrant abdominal pain, vaginal
bleeding. Status post C-section 2 weeks previously.

EXAM:
CTA ABDOMEN AND PELVIS WITHOUT AND WITH CONTRAST
TECHNIQUE: Multidetector CT imaging of the abdomen and pelvis was performed
using the standard protocol during bolus administration of
intravenous contrast. Multiplanar reconstructed images and MIPs were
obtained and reviewed to evaluate the vascular anatomy.

[Series 11: thins · axial · 0.67mm/px · z∈[+810,+1162]mm · 11 of 993 slices shown, 15 images]
[im 56/993  soft-tissue]
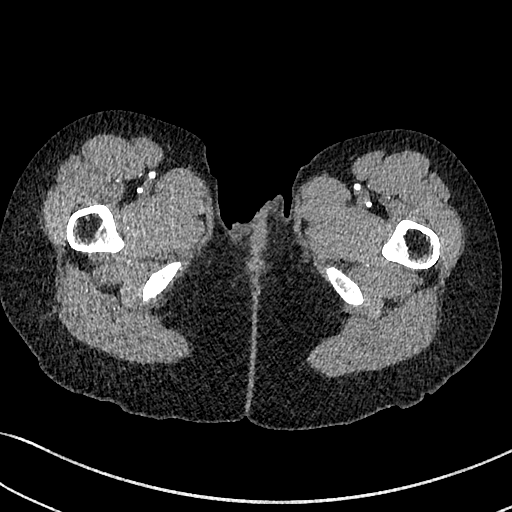
[im 56/993  bone]
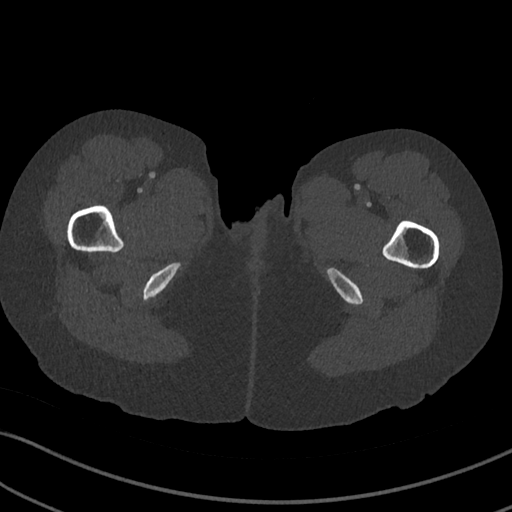
[im 166/993  soft-tissue]
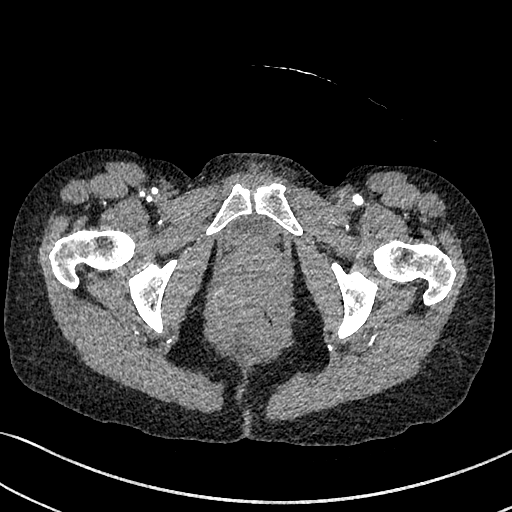
[im 276/993  soft-tissue]
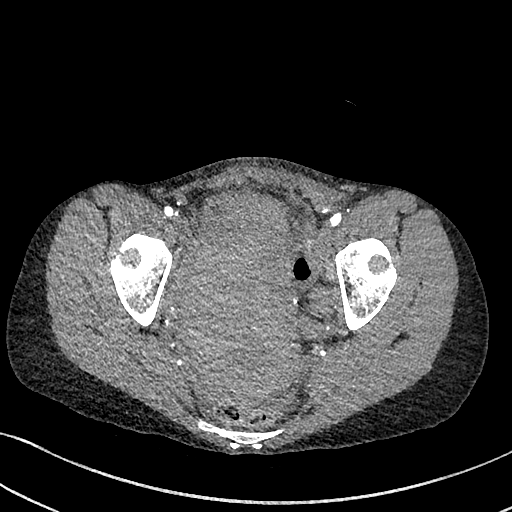
[im 386/993  soft-tissue]
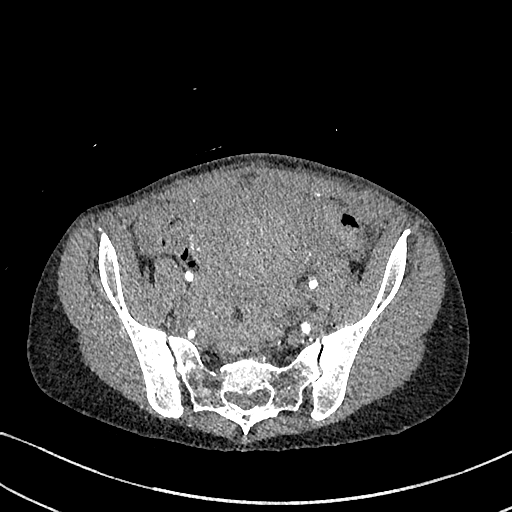
[im 497/993  soft-tissue]
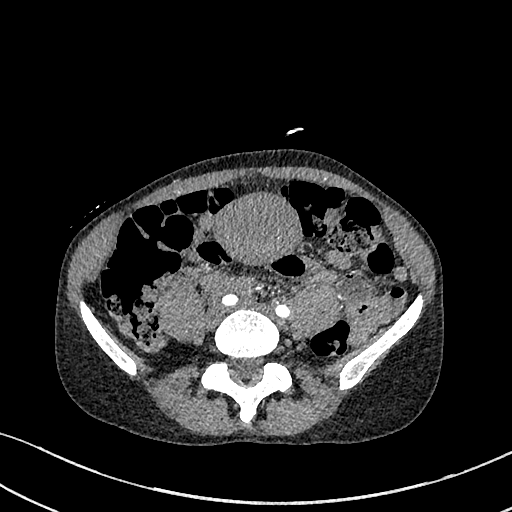
[im 607/993  soft-tissue]
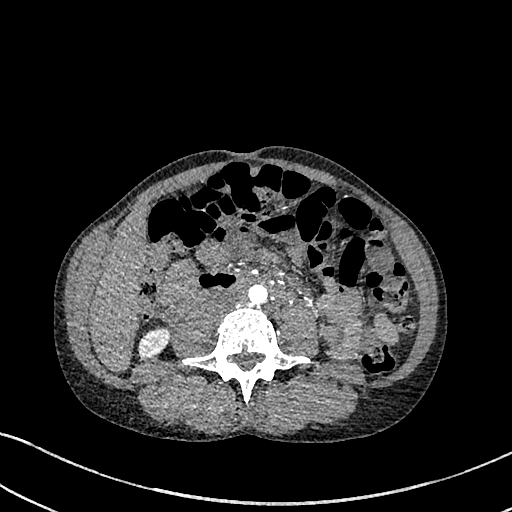
[im 717/993  soft-tissue]
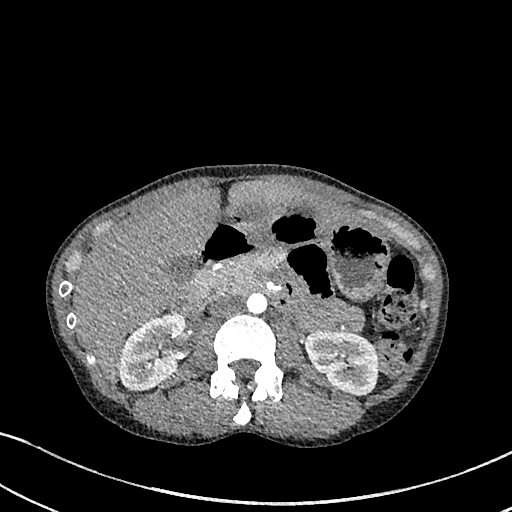
[im 772/993  lung]
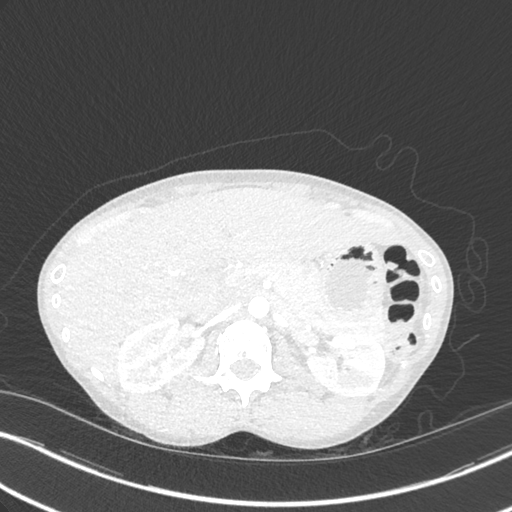
[im 827/993  soft-tissue]
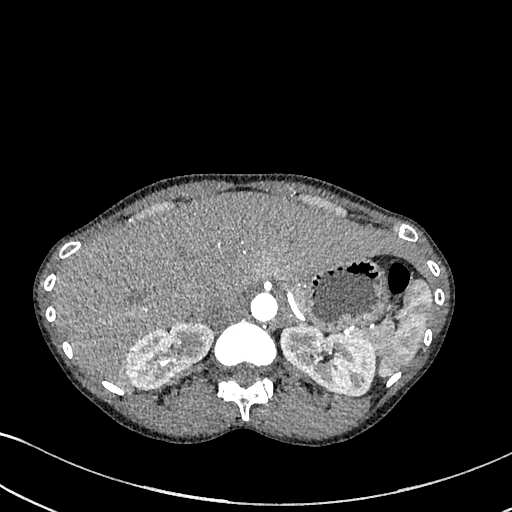
[im 827/993  lung]
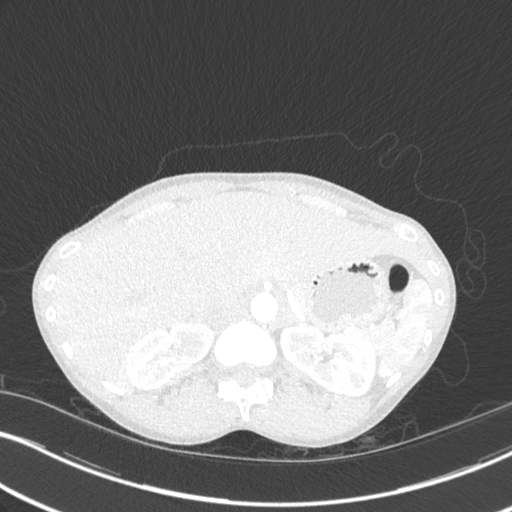
[im 882/993  lung]
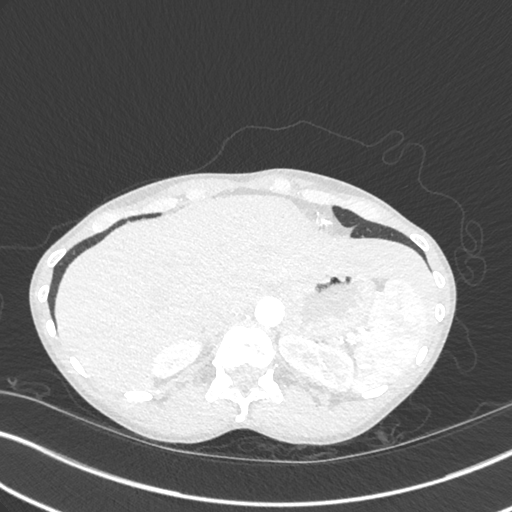
[im 937/993  soft-tissue]
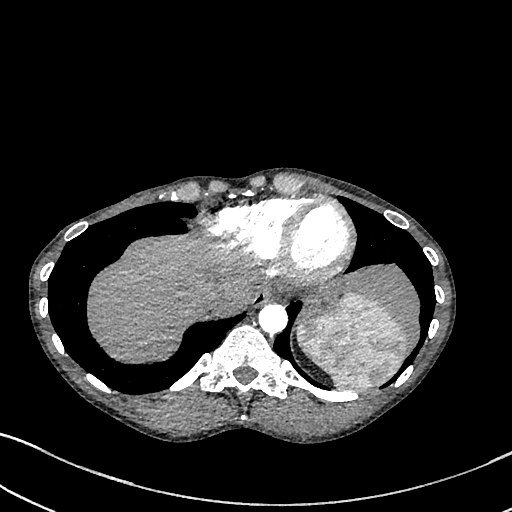
[im 937/993  lung]
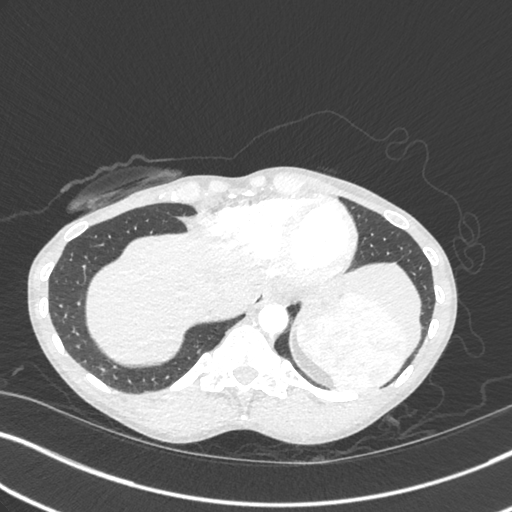
[im 937/993  bone]
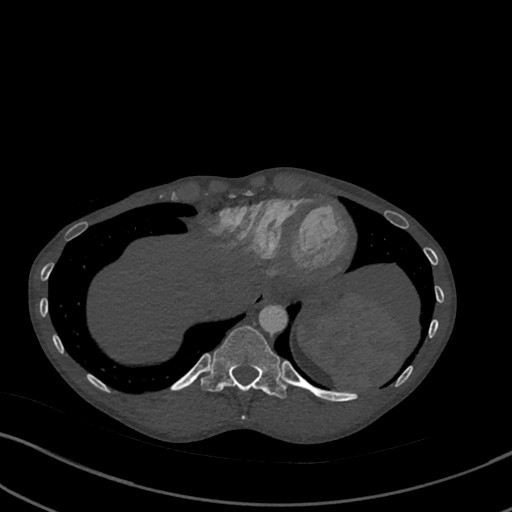

[Series 13: cor · coronal · 0.61mm/px · 2 of 117 slices shown]
[im 39/117  soft-tissue]
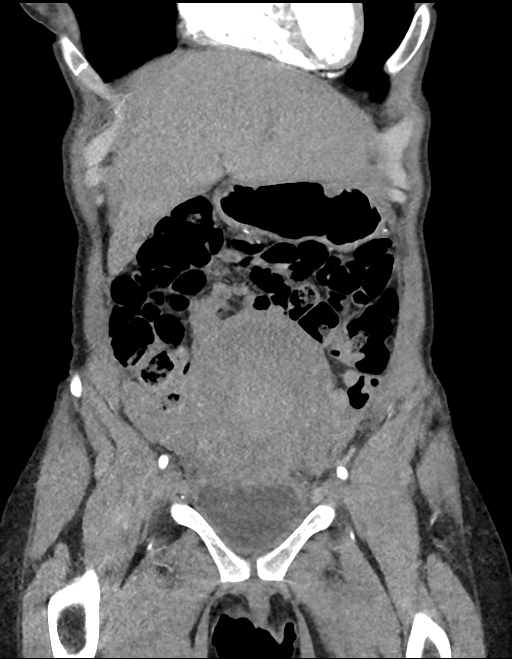
[im 78/117  soft-tissue]
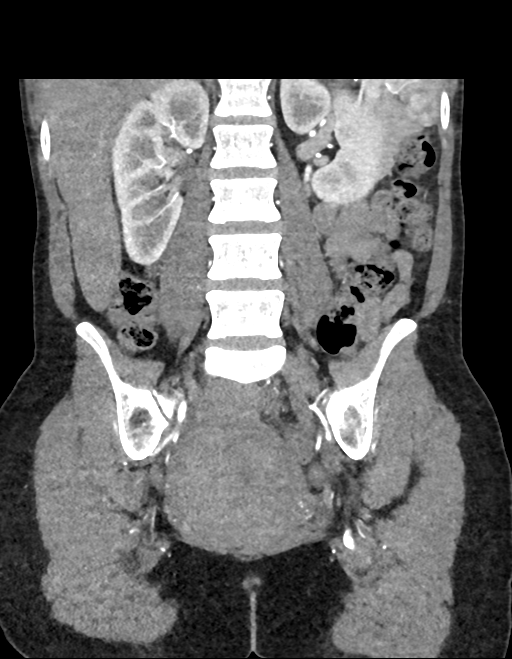

[13 of 46 positions shown; findings below may reference images not displayed]

RADIATION DOSE REDUCTION: This exam was performed according to the
departmental dose-optimization program which includes automated
exposure control, adjustment of the mA and/or kV according to
patient size and/or use of iterative reconstruction technique.

CONTRAST:  100mL OMNIPAQUE IOHEXOL 350 MG/ML SOLN
FINDINGS: VASCULAR

Aorta: Normal caliber aorta without aneurysm, dissection, vasculitis
or significant stenosis.

Celiac: Patent without evidence of aneurysm, dissection, vasculitis
or significant stenosis. Lateral segmental branch of the left
hepatic artery is replaced to the left gastric artery.

SMA: Patent without evidence of aneurysm, dissection, vasculitis or
significant stenosis.

Renals: Both renal arteries are patent without evidence of aneurysm,
dissection, vasculitis, fibromuscular dysplasia or significant
stenosis.

IMA: Patent without evidence of aneurysm, dissection, vasculitis or
significant stenosis.

Inflow: Patent without evidence of aneurysm, dissection, vasculitis
or significant stenosis.

Proximal Outflow: Bilateral common femoral and visualized portions
of the superficial and profunda femoral arteries are patent without
evidence of aneurysm, dissection, vasculitis or significant
stenosis.

Veins: No obvious venous abnormality within the limitations of this
arterial phase study.

Review of the MIP images confirms the above findings.

NON-VASCULAR

Lower chest: No acute abnormality.

Hepatobiliary: No focal liver abnormality is seen. No gallstones,
gallbladder wall thickening, or biliary dilatation.

Pancreas: Unremarkable. No pancreatic ductal dilatation or
surrounding inflammatory changes.

Spleen: Normal in size without focal abnormality.

Adrenals/Urinary Tract: Adrenal glands are unremarkable. Kidneys are
normal, without renal calculi, focal lesion, or hydronephrosis.
Bladder is unremarkable.

Stomach/Bowel: Stomach is within normal limits. Appendix appears
normal. No evidence of bowel wall thickening, distention, or
inflammatory changes.

Lymphatic: No suspicious lymphadenopathy.

Reproductive: Massively enlarged uterus with marked distension of
the endometrial canal containing heterogeneously high attenuation
material. No evidence of active arterial bleeding on arterial phase
imaging.

Other: C-section incision. Approximately 2.4 x 1.3 cm
low-attenuation collection subjacent to the left lateral margin of
the incision likely representing a small seroma.

Musculoskeletal: No acute or significant osseous findings.
IMPRESSION: 1. Markedly enlarged uterus with diffusely dilated endometrial canal
containing heterogeneous high attenuation material concerning for
blood products/hematoma. No evidence of active bleeding on arterial
phase imaging. Differential considerations include retained hematoma
versus retained products of conception. Consider further evaluation
with transabdominal/transvaginal ultrasound including Doppler
imaging to assess for persistently vascularized tissue.
2. Small 2.4 x 1.3 cm circumscribed low-attenuation collection
subjacent to the left lateral margin of the C-section incision
likely representing a small seroma.

## 2023-05-04 DIAGNOSIS — Z0000 Encounter for general adult medical examination without abnormal findings: Secondary | ICD-10-CM | POA: Diagnosis not present

## 2023-05-04 DIAGNOSIS — N946 Dysmenorrhea, unspecified: Secondary | ICD-10-CM | POA: Diagnosis not present

## 2023-05-04 DIAGNOSIS — Z1159 Encounter for screening for other viral diseases: Secondary | ICD-10-CM | POA: Diagnosis not present

## 2023-05-04 DIAGNOSIS — Z114 Encounter for screening for human immunodeficiency virus [HIV]: Secondary | ICD-10-CM | POA: Diagnosis not present

## 2023-05-04 DIAGNOSIS — R634 Abnormal weight loss: Secondary | ICD-10-CM | POA: Diagnosis not present

## 2023-08-14 ENCOUNTER — Encounter (HOSPITAL_COMMUNITY): Payer: Self-pay

## 2023-08-14 ENCOUNTER — Emergency Department (HOSPITAL_COMMUNITY): Payer: 59

## 2023-08-14 ENCOUNTER — Emergency Department (HOSPITAL_COMMUNITY)
Admission: EM | Admit: 2023-08-14 | Discharge: 2023-08-15 | Disposition: A | Discharge status: Home / Self Care | Payer: 59 | Attending: Emergency Medicine | Admitting: Emergency Medicine

## 2023-08-14 ENCOUNTER — Other Ambulatory Visit: Payer: Self-pay

## 2023-08-14 DIAGNOSIS — S99921A Unspecified injury of right foot, initial encounter: Secondary | ICD-10-CM | POA: Insufficient documentation

## 2023-08-14 DIAGNOSIS — W108XXA Fall (on) (from) other stairs and steps, initial encounter: Secondary | ICD-10-CM | POA: Diagnosis not present

## 2023-08-14 DIAGNOSIS — S99821A Other specified injuries of right foot, initial encounter: Secondary | ICD-10-CM | POA: Diagnosis not present

## 2023-08-14 MED ORDER — IBUPROFEN 200 MG PO TABS: 600.0000 mg | ORAL_TABLET | Freq: Once | ORAL | Status: DC

## 2023-08-14 NOTE — ED Provider Triage Note (Signed)
Emergency Medicine Provider Triage Evaluation Note  Melissa Townsend , a 37 y.o. female  was evaluated in triage.  Pt complains of toe pain.  States she banged the right second toe on a door yesterday.  Now pain and swelling.  Still able to ambulate bear weight.  Review of Systems  Positive: See above Negative: See above  Physical Exam  BP 122/81 (BP Location: Left Arm)   Pulse (!) 115   Temp 99.4 F (37.4 C) (Oral)   Resp 18   SpO2 100%  Gen:   Awake, no distress  Resp:  Normal effort  MSK:   Moves extremities without difficulty  Other:    Medical Decision Making  Medically screening exam initiated at 9:27 PM.  Appropriate orders placed.  Teresa Pelton was informed that the remainder of the evaluation will be completed by another provider, this initial triage assessment does not replace that evaluation, and the importance of remaining in the ED until their evaluation is complete.  Work up started   Gareth Eagle, [place] 08/14/23 2128

## 2023-08-14 NOTE — ED Triage Notes (Signed)
Pt arrived from home via POV s/p fall down 5 wood steps denies any other injury but right foot/ankle pain. Noted to have Lim ROM 10/10 pain

## 2023-08-15 MED ORDER — ACETAMINOPHEN 500 MG PO TABS
1000.0000 mg | ORAL_TABLET | Freq: Once | ORAL | Status: AC
  Administered 2023-08-15: 1000 mg via ORAL
  Filled 2023-08-15: qty 2

## 2023-08-15 NOTE — Discharge Instructions (Signed)
Likely you injured your right toe, have given you a postop shoe please wear while ambulating, I would recommend buddy taping your toe to your big toes this will help with pain and stabilization of the foot.  Recommend over-the-counter pain medication as needed.  If Symptoms not improving after weeks time please follow-up with orthopedics for further assessment  Come back to the emergency department if you develop chest pain, shortness of breath, severe abdominal pain, uncontrolled nausea, vomiting, diarrhea.

## 2023-08-15 NOTE — ED Provider Notes (Signed)
[place] EMERGENCY DEPARTMENT AT [facility] Provider Note   CSN: [id number] Arrival date & time: 08/14/23  7829     History  Chief Complaint  Patient presents with   Foot Injury    Melissa Townsend is a 37 y.o. female.  HPI   Patient has significant medical history presenting with complaints of right toe pain, patient states that she had a mechanical fall yesterday, she states she has pain just on her second right digit on her foot, she denies any pain in her ankle or knee, she states she did not strike her head or lose consciousness she is not on any anticoag's.  Patient denies any paresthesia or weakness moving down her foot, she has no other complaints.  Home Medications Prior to Admission medications   Medication Sig Start Date End Date Taking? Authorizing Provider  labetalol (NORMODYNE) 100 MG tablet Take 2 tablets (200 mg total) by mouth 2 (two) times daily for 14 days. 02/27/22 03/13/22  Curatolo, Adam, DO  misoprostol (CYTOTEC) 200 MCG tablet Take 4 tablets (800 mcg total) by mouth once for 1 dose. Place two pills between gum and cheek on either side of the mouth and let absorb for 30 minutes. Swallow if not completely dissolved in 30 minutes. Repeat in 3 days if still bleeding. 03/08/22 03/08/22  Marylene Land, CNM  oxyCODONE-acetaminophen (PERCOCET) 10-325 MG tablet Take 1 tablet by mouth every 4 (four) hours as needed for pain. 03/08/22   Marylene Land, CNM      Allergies    Patient has no known allergies.    Review of Systems   Review of Systems  Constitutional:  Negative for chills and fever.  Respiratory:  Negative for shortness of breath.   Cardiovascular:  Negative for chest pain.  Gastrointestinal:  Negative for abdominal pain.  Musculoskeletal:        Toe pain  Neurological:  Negative for headaches.    Physical Exam Updated Vital Signs BP 107/65   Pulse 83   Temp 98.2 F (36.8 C) (Oral)   Resp 16   Ht 5\' 5"  (1.651  m)   Wt 49.9 kg   LMP 06/24/2023 (Exact Date)   SpO2 100%   BMI 18.30 kg/m  Physical Exam Vitals and nursing note reviewed.  Constitutional:      General: She is not in acute distress.    Appearance: Normal appearance. She is not ill-appearing or diaphoretic.  HENT:     Head: Normocephalic and atraumatic.  Eyes:     Conjunctiva/sclera: Conjunctivae normal.  Cardiovascular:     Rate and Rhythm: Normal rate.  Pulmonary:     Effort: Pulmonary effort is normal.  Musculoskeletal:     Cervical back: Neck supple.     Comments: Focused exam of the right foot was unremarkable, no evidence of erythema or edema there is no gross bony abnormalities present, she has point tenderness noted on the DIP joint of her second digit, sensation tact to light touch, 2-second capillary refill, she has full range of motion in all of her toes ankle and knee.  She has 2+ dorsal pedal pulses.  Skin:    General: Skin is warm and dry.  Neurological:     Mental Status: She is alert.  Psychiatric:        Mood and Affect: Mood normal.     ED Results / Procedures / Treatments   Labs (all labs ordered are listed, but only abnormal results are displayed) Labs  Reviewed - No data to display  EKG None  Radiology DG Foot Complete Right  Result Date: 08/14/2023 CLINICAL DATA:  Status post trauma. EXAM: RIGHT FOOT COMPLETE - 3+ VIEW COMPARISON:  None Available. FINDINGS: There is no evidence of fracture or dislocation. There is no evidence of arthropathy or other focal bone abnormality. Soft tissues are unremarkable. IMPRESSION: Negative. Electronically Signed   By: Aram Candela M.D.   On: 08/14/2023 23:48    Procedures Procedures    Medications Ordered in ED Medications  ibuprofen (ADVIL) tablet 600 mg (has no administration in time range)  acetaminophen (TYLENOL) tablet 1,000 mg (1,000 mg Oral Given 08/15/23 0212)    ED Course/ Medical Decision Making/ A&P                                 Medical  Decision Making Risk OTC drugs.   This patient presents to the ED for concern of foot pain, this involves an extensive number of treatment options, and is a complaint that carries with it a high risk of complications and morbidity.  The differential diagnosis includes fracture, dislocation, compartment syndrome, vascular injury.    Additional history obtained:  Additional history obtained from N/A External records from outside source obtained and reviewed including PCP notes   Co morbidities that complicate the patient evaluation  N/a  Social Determinants of Health:  N/A    Lab Tests:  I Ordered, and personally interpreted labs.  The pertinent results include:  n/a   Imaging Studies ordered:  I ordered imaging studies including DG right foot   I independently visualized and interpreted imaging which showed negative for acute finding I agree with the radiologist interpretation   Cardiac Monitoring:  The patient was maintained on a cardiac monitor.  I personally viewed and interpreted the cardiac monitored which showed an underlying rhythm of: n/a   Medicines ordered and prescription drug management:  I ordered medication including Tylenol I have reviewed the patients home medicines and have made adjustments as needed  Critical Interventions:  N/a   Reevaluation:  Presented with pain to the right foot, triage obtain imaging which I personally reviewed unremarkable she has a benign physical exam agreement discharge at this time.  Consultations Obtained:  N/a    Test Considered:  N/a    Rule out I have low suspicion for septic arthritis as patient denies IV drug use, skin exam was performed no erythematous, edematous, warm joints noted on exam, no new heart murmur heard on exam.  Low suspicion for fracture or dislocation as x-ray does not feel any significant findings. low suspicion for flexor/extensor tendon damage she has full range of motion of all  joints of her second digit.  I doubt vascular injury as her toe neurovascularly intact..  Low suspicion for compartment syndrome as area was palpated it was soft to the touch, neurovascular fully intact.     Dispostion and problem list  After consideration of the diagnostic results and the patients response to treatment, I feel that the patent would benefit from discharge.  Right second toe pain-likely muscular injury, will buddy tape toe, placed her in a postop shoe, can follow-up with orthopedics as needed strict return precautions.            Final Clinical Impression(s) / ED Diagnoses Final diagnoses:  Injury of toe on right foot, initial encounter    Rx / DC Orders ED Discharge Orders  None         Barnie Del 08/15/23 0220    Tilden Fossa, MD 08/15/23 [phone]

## 2023-08-22 DIAGNOSIS — M129 Arthropathy, unspecified: Secondary | ICD-10-CM | POA: Diagnosis not present

## 2023-09-28 ENCOUNTER — Encounter (HOSPITAL_COMMUNITY): Payer: Self-pay

## 2023-09-28 ENCOUNTER — Ambulatory Visit (HOSPITAL_COMMUNITY)
Admission: EM | Admit: 2023-09-28 | Discharge: 2023-09-28 | Disposition: A | Discharge status: Home / Self Care | Payer: 59 | Attending: Internal Medicine | Admitting: Internal Medicine

## 2023-09-28 DIAGNOSIS — S0990XA Unspecified injury of head, initial encounter: Secondary | ICD-10-CM | POA: Diagnosis not present

## 2023-09-28 DIAGNOSIS — S0181XA Laceration without foreign body of other part of head, initial encounter: Secondary | ICD-10-CM
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
## 2023-09-28 NOTE — Discharge Instructions (Signed)
Suture removal 5 to 7 days

## 2023-10-03 ENCOUNTER — Encounter (HOSPITAL_COMMUNITY): Payer: Self-pay

## 2023-10-03 ENCOUNTER — Ambulatory Visit (HOSPITAL_COMMUNITY)
Admission: EM | Admit: 2023-10-03 | Discharge: 2023-10-03 | Disposition: A | Discharge status: Home / Self Care | Payer: 59

## 2023-10-03 DIAGNOSIS — Z4802 Encounter for removal of sutures: Secondary | ICD-10-CM | POA: Diagnosis not present

## 2023-10-03 NOTE — Discharge Instructions (Signed)
Left eye laceration appears to be healing well, no signs or symptoms of infection. 4 sutures removed today.

## 2023-10-03 NOTE — ED Triage Notes (Signed)
Pt is here for suture removal at this time. Pt currently has 4 sutures in place above left eye.

## 2023-10-03 NOTE — ED Notes (Addendum)
Left eye laceration appears to be healing well, no signs or symptoms of infection. 4 sutures removed today.
